# Patient Record
Sex: Male | Born: 1995 | Race: White | Hispanic: No | Marital: Single | Smoking: Former smoker
Health system: Southern US, Community
[De-identification: ages and names within clinical notes are randomized; demographics above are authoritative.]

## PROBLEM LIST (undated history)

## (undated) DIAGNOSIS — G43909 Migraine, unspecified, not intractable, without status migrainosus: Secondary | ICD-10-CM

## (undated) DIAGNOSIS — T7840XA Allergy, unspecified, initial encounter: Secondary | ICD-10-CM

## (undated) HISTORY — DX: Allergy, unspecified, initial encounter: T78.40XA

## (undated) HISTORY — PX: ADENOIDECTOMY: SUR15

## (undated) HISTORY — PX: HYDROCELE EXCISION: SHX482

---
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
## 2014-02-28 ENCOUNTER — Encounter: Payer: Self-pay | Admitting: Family Medicine

## 2014-02-28 ENCOUNTER — Ambulatory Visit (INDEPENDENT_AMBULATORY_CARE_PROVIDER_SITE_OTHER): Payer: Medicaid Other | Admitting: Family Medicine

## 2014-02-28 ENCOUNTER — Ambulatory Visit (INDEPENDENT_AMBULATORY_CARE_PROVIDER_SITE_OTHER): Payer: Medicaid Other | Admitting: *Deleted

## 2014-02-28 VITALS — BP 149/78 | HR 81 | Temp 98.1°F | Wt 160.0 lb

## 2014-02-28 DIAGNOSIS — Z23 Encounter for immunization: Secondary | ICD-10-CM

## 2014-02-28 DIAGNOSIS — A084 Viral intestinal infection, unspecified: Secondary | ICD-10-CM

## 2014-02-28 NOTE — Progress Notes (Signed)
Patient ID: Minda MeoSteven A Eckford, male   DOB: 05/04/1995, 19 y.o.   MRN: [id number] Subjective:   CC: Throwing up  HPI:   1/6 was starting to feel abdominal discomfort and on 7th he vomited what he ate, nonbloody and nonbilious.  Girlfriend was recently sick who maybe had similar symptoms. Febrile 7th night and 8th morning. Able to stay hydrated is almost eating normally. Normal urination. Feels nearly completely better. Had mild itchy throat with cough.  No diarrhea. Some stomach pain when it was uncomfortable, none now. No new medicines or foods.  Missed 02/26/14 workday and requests work note.  Review of Systems - Per HPI.   PMH - ADHD, tobacco abuse, viral gastro    Objective:  Physical Exam BP 149/78 mmHg  Pulse 81  Temp(Src) 98.1 F (36.7 C) (Oral)  Wt 160 lb (72.576 kg) GEN: NAD ABD: Soft, nontender, nondistended PULM: Normal effort    Assessment:     Minda MeoSteven A Guiles is a 19 y.o. male here for recent vomiting.    Plan:     # See problem list and after visit summary for problem-specific plans.   Follow-up: Follow up in 1 week PRN for worsening symptoms.   Leona SingletonMaria T Yailene Badia, MD [facility]

## 2014-02-28 NOTE — Assessment & Plan Note (Signed)
Viral gastro 1/6 for 2-3 days, resolved now. - Work note provided. - Return precautions reviewed.

## 2014-02-28 NOTE — Patient Instructions (Signed)
This was likely a viral bug.  I am glad you are feeling better. Stay hydrated. If you have worsened symptoms, return for evaluation.  Best,  Nicholas SingletonMaria T Sherica Paternostro, MD

## 2014-06-05 ENCOUNTER — Encounter (HOSPITAL_COMMUNITY): Payer: Self-pay | Admitting: *Deleted

## 2014-06-05 ENCOUNTER — Telehealth: Payer: Self-pay | Admitting: Family Medicine

## 2014-06-05 ENCOUNTER — Emergency Department (INDEPENDENT_AMBULATORY_CARE_PROVIDER_SITE_OTHER)
Admission: EM | Admit: 2014-06-05 | Discharge: 2014-06-05 | Disposition: A | Discharge status: Home / Self Care | Payer: Medicaid Other | Source: Home / Self Care | Attending: Family Medicine | Admitting: Family Medicine

## 2014-06-05 DIAGNOSIS — L738 Other specified follicular disorders: Secondary | ICD-10-CM | POA: Diagnosis not present

## 2014-06-05 MED ORDER — DOXYCYCLINE HYCLATE 100 MG PO CAPS: 100.0000 mg | ORAL_CAPSULE | Freq: Two times a day (BID) | ORAL | Status: DC

## 2014-06-05 NOTE — ED Notes (Signed)
Pt  Reports  Several  Red swollen   Boils under  l  Armpit      X  2-3  Days         Has  Had  Similar   Boils  Other  Parts of  His  Body

## 2014-06-05 NOTE — ED Provider Notes (Signed)
CSN: [id number]     Arrival date & time 06/05/14  1445 History   First MD Initiated Contact with Patient 06/05/14 1520     Chief Complaint  Patient presents with  . Abscess   (Consider location/radiation/quality/duration/timing/severity/associated sxs/prior Treatment) Patient is a 19 y.o. male presenting with abscess. The history is provided by the patient.  Abscess Location:  Shoulder/arm Shoulder/arm abscess location:  L axilla Abscess quality: induration, painful and redness   Abscess quality: not draining and no fluctuance   Red streaking: no   Duration:  3 days Progression:  Unchanged Pain details:    Quality:  Sharp   Severity:  Mild Chronicity:  Recurrent Relieved by:  None tried Worsened by:  Nothing tried Ineffective treatments:  None tried Associated symptoms: no fever     Past Medical History  Diagnosis Date  . Attention deficit disorder   . Asthma   . Allergy   . Migraines    Past Surgical History  Procedure Laterality Date  . Adenoidectomy    . Hydrocele excision     Family History  Problem Relation Age of Onset  . Bipolar disorder Mother    History  Substance Use Topics  . Smoking status: Former Smoker    Types: Cigarettes  . Smokeless tobacco: Not on file  . Alcohol Use: No    Review of Systems  Constitutional: Negative.  Negative for fever.  Skin: Positive for rash.    Allergies  Bee venom and Cefaclor  Home Medications   Prior to Admission medications   Medication Sig Start Date End Date Taking? Authorizing Provider  albuterol (PROVENTIL HFA;VENTOLIN HFA) 108 (90 BASE) MCG/ACT inhaler Inhale 2 puffs into the lungs every 4 (four) hours as needed. For shortness of breath 11/02/12   Ozella Rocks, MD  cyclobenzaprine (FLEXERIL) 10 MG tablet Take 1 tablet (10 mg total) by mouth 3 (three) times daily as needed for muscle spasms. 10/18/13   Renee A Kuneff, DO  doxycycline (VIBRAMYCIN) 100 MG capsule Take 1 capsule (100 mg total) by mouth 2  (two) times daily. 06/05/14   Linna Hoff, MD  EPINEPHrine (EPIPEN) 0.3 mg/0.3 mL SOAJ injection Inject 0.3 mLs (0.3 mg total) into the muscle once. As needed for allergic reaction 11/02/12   Ozella Rocks, MD  fluticasone [facility]) 50 MCG/ACT nasal spray Place 2 sprays into the nose daily. 11/02/12   Ozella Rocks, MD  ibuprofen (ADVIL,MOTRIN) 400 MG tablet Take 1 tablet (400 mg total) by mouth every 6 (six) hours as needed. 10/22/13   Marissa Sciacca, PA-C  ibuprofen (ADVIL,MOTRIN) 600 MG tablet Take 1 tablet (600 mg total) by mouth every 6 (six) hours as needed. 12/26/12   Ozella Rocks, MD  loperamide (IMODIUM) 2 MG capsule Take 1 capsule (2 mg total) by mouth 4 (four) times daily as needed for diarrhea or loose stools. 05/29/13   Graylon Good, PA-C  methylphenidate (CONCERTA) 27 MG CR tablet Take 1 tablet (27 mg total) by mouth every morning. 09/04/12   Ozella Rocks, MD  methylphenidate (CONCERTA) 27 MG CR tablet Take 1 tablet (27 mg total) by mouth every morning. 09/04/12   Ozella Rocks, MD  methylphenidate (CONCERTA) 27 MG CR tablet Take 1 tablet (27 mg total) by mouth every morning. 09/04/12 10/04/12  Ozella Rocks, MD  nicotine polacrilex (COMMIT) 2 MG lozenge Take 1 lozenge (2 mg total) by mouth as needed for smoking cessation. 03/15/13   Ozella Rocks, MD  ondansetron (ZOFRAN ODT) 4 MG disintegrating tablet Take 1 tablet (4 mg total) by mouth every 8 (eight) hours as needed for nausea or vomiting. 06/25/13   [place] Peaches[place], MD   BP 128/64 mmHg  Pulse 72  Temp(Src) 98 F (36.7 C) (Oral)  Resp 16  SpO2 100% Physical Exam  Constitutional: He is oriented to person, place, and time. He appears well-developed and well-nourished.  Neurological: He is alert and oriented to person, place, and time.  Skin: Skin is warm and dry. Rash noted.  2 erythematous 1.5cm lesions to left axilla, tender, nonfluctuant.  Nursing note and vitals reviewed.   ED Course  Procedures  (including critical care time) Labs Review Labs Reviewed - No data to display  Imaging Review No results found.   MDM   1. Bacterial folliculitis        Linna HoffJames D Kerianne Gurr, MD 06/05/14 [phone]

## 2014-06-05 NOTE — Discharge Instructions (Signed)
Warm compress twice a day when you take the antibiotic, take all of medicine, return as needed. °

## 2014-06-05 NOTE — Telephone Encounter (Signed)
Emergency Line Call  [name] calls to report that he has two bumps under his left arm that are red and swollen and painful. They are not open / bleeding / draining, but the larger of the two "looks like it's about to burst open." His pain is severe enough that it prevents him from moving his arm "all the way" (i.e., through a normal ROM). He has had no chills, N/V, abdominal pain. Mother reports he has had similar places under his other arm that required I&D, but this was quite some time ago.  Advised mother that pt can go to Urgent Care or come into the emergency room, today, to be evaluated for either I&D or abx therapy (or whatever else may be indicated, depending on appearance of the lesions). Mother voiced understanding; pt audible in background answering some questions with answers as above.  Note FYI to Dr. Jordan LikesSchmitz (PCP).  Bobbye Mortonhristopher M Jasun Gasparini, MD PGY-3, [facility] 06/05/2014, 1:53 PM
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
## 2015-06-14 ENCOUNTER — Encounter (HOSPITAL_COMMUNITY): Payer: Self-pay | Admitting: Family Medicine

## 2015-06-14 ENCOUNTER — Emergency Department (HOSPITAL_COMMUNITY): Payer: Self-pay

## 2015-06-14 ENCOUNTER — Emergency Department (HOSPITAL_COMMUNITY)
Admission: EM | Admit: 2015-06-14 | Discharge: 2015-06-14 | Disposition: A | Discharge status: Home / Self Care | Payer: Self-pay | Attending: Emergency Medicine | Admitting: Emergency Medicine

## 2015-06-14 DIAGNOSIS — S3992XA Unspecified injury of lower back, initial encounter: Secondary | ICD-10-CM | POA: Insufficient documentation

## 2015-06-14 DIAGNOSIS — Y9389 Activity, other specified: Secondary | ICD-10-CM | POA: Insufficient documentation

## 2015-06-14 DIAGNOSIS — Z79899 Other long term (current) drug therapy: Secondary | ICD-10-CM | POA: Insufficient documentation

## 2015-06-14 DIAGNOSIS — M25531 Pain in right wrist: Secondary | ICD-10-CM

## 2015-06-14 DIAGNOSIS — S4991XA Unspecified injury of right shoulder and upper arm, initial encounter: Secondary | ICD-10-CM | POA: Insufficient documentation

## 2015-06-14 DIAGNOSIS — Z7951 Long term (current) use of inhaled steroids: Secondary | ICD-10-CM | POA: Insufficient documentation

## 2015-06-14 DIAGNOSIS — Y998 Other external cause status: Secondary | ICD-10-CM | POA: Insufficient documentation

## 2015-06-14 DIAGNOSIS — J45909 Unspecified asthma, uncomplicated: Secondary | ICD-10-CM | POA: Insufficient documentation

## 2015-06-14 DIAGNOSIS — Z792 Long term (current) use of antibiotics: Secondary | ICD-10-CM | POA: Insufficient documentation

## 2015-06-14 DIAGNOSIS — Z87891 Personal history of nicotine dependence: Secondary | ICD-10-CM | POA: Insufficient documentation

## 2015-06-14 DIAGNOSIS — G43909 Migraine, unspecified, not intractable, without status migrainosus: Secondary | ICD-10-CM | POA: Insufficient documentation

## 2015-06-14 DIAGNOSIS — Y92410 Unspecified street and highway as the place of occurrence of the external cause: Secondary | ICD-10-CM | POA: Insufficient documentation

## 2015-06-14 DIAGNOSIS — S6991XA Unspecified injury of right wrist, hand and finger(s), initial encounter: Secondary | ICD-10-CM | POA: Insufficient documentation

## 2015-06-14 DIAGNOSIS — M25511 Pain in right shoulder: Secondary | ICD-10-CM

## 2015-06-14 DIAGNOSIS — F909 Attention-deficit hyperactivity disorder, unspecified type: Secondary | ICD-10-CM | POA: Insufficient documentation

## 2015-06-14 DIAGNOSIS — S199XXA Unspecified injury of neck, initial encounter: Secondary | ICD-10-CM | POA: Insufficient documentation

## 2015-06-14 MED ORDER — CYCLOBENZAPRINE HCL 10 MG PO TABS: 10.0000 mg | ORAL_TABLET | Freq: Two times a day (BID) | ORAL | Status: DC | PRN

## 2015-06-14 MED ORDER — IBUPROFEN 600 MG PO TABS: 600.0000 mg | ORAL_TABLET | Freq: Four times a day (QID) | ORAL | Status: DC | PRN

## 2015-06-14 MED ORDER — HYDROCODONE-ACETAMINOPHEN 5-325 MG PO TABS: 1.0000 | ORAL_TABLET | Freq: Four times a day (QID) | ORAL | Status: DC | PRN
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
## 2015-06-14 NOTE — Discharge Instructions (Signed)
Concussion, Adult °A concussion, or closed-head injury, is a brain injury caused by a direct blow to the head or by a quick and sudden movement (jolt) of the head or neck. Concussions are usually not life-threatening. Even so, the effects of a concussion can be serious. If you have had a concussion before, you are more likely to experience concussion-like symptoms after a direct blow to the head.  °CAUSES °· Direct blow to the head, such as from running into another player during a soccer game, being hit in a fight, or hitting your head on a hard surface. °· A jolt of the head or neck that causes the brain to move back and forth inside the skull, such as in a car crash. °SIGNS AND SYMPTOMS °The signs of a concussion can be hard to notice. Early on, they may be missed by you, family members, and health care providers. You may look fine but act or feel differently. °Symptoms are usually temporary, but they may last for days, weeks, or even longer. Some symptoms may appear right away while others may not show up for hours or days. Every head injury is different. Symptoms include: °· Mild to moderate headaches that will not go away. °· A feeling of pressure inside your head. °· Having more trouble than usual: °¨ Learning or remembering things you have heard. °¨ Answering questions. °¨ Paying attention or concentrating. °¨ Organizing daily tasks. °¨ Making decisions and solving problems. °· Slowness in thinking, acting or reacting, speaking, or reading. °· Getting lost or being easily confused. °· Feeling tired all the time or lacking energy (fatigued). °· Feeling drowsy. °· Sleep disturbances. °¨ Sleeping more than usual. °¨ Sleeping less than usual. °¨ Trouble falling asleep. °¨ Trouble sleeping (insomnia). °· Loss of balance or feeling lightheaded or dizzy. °· Nausea or vomiting. °· Numbness or tingling. °· Increased sensitivity to: °¨ Sounds. °¨ Lights. °¨ Distractions. °· Vision problems or eyes that tire  easily. °· Diminished sense of taste or smell. °· Ringing in the ears. °· Mood changes such as feeling sad or anxious. °· Becoming easily irritated or angry for little or no reason. °· Lack of motivation. °· Seeing or hearing things other people do not see or hear (hallucinations). °DIAGNOSIS °Your health care provider can usually diagnose a concussion based on a description of your injury and symptoms. He or she will ask whether you passed out (lost consciousness) and whether you are having trouble remembering events that happened right before and during your injury. °Your evaluation might include: °· A brain scan to look for signs of injury to the brain. Even if the test shows no injury, you may still have a concussion. °· Blood tests to be sure other problems are not present. °TREATMENT °· Concussions are usually treated in an emergency department, in urgent care, or at a clinic. You may need to stay in the hospital overnight for further treatment. °· Tell your health care provider if you are taking any medicines, including prescription medicines, over-the-counter medicines, and natural remedies. Some medicines, such as blood thinners (anticoagulants) and aspirin, may increase the chance of complications. Also tell your health care provider whether you have had alcohol or are taking illegal drugs. This information may affect treatment. °· Your health care provider will send you home with important instructions to follow. °· How fast you will recover from a concussion depends on many factors. These factors include how severe your concussion is, what part of your brain was injured,   your age, and how healthy you were before the concussion. °· Most people with mild injuries recover fully. Recovery can take time. In general, recovery is slower in older persons. Also, persons who have had a concussion in the past or have other medical problems may find that it takes longer to recover from their current injury. °HOME  CARE INSTRUCTIONS °General Instructions °· Carefully follow the directions your health care provider gave you. °· Only take over-the-counter or prescription medicines for pain, discomfort, or fever as directed by your health care provider. °· Take only those medicines that your health care provider has approved. °· Do not drink alcohol until your health care provider says you are well enough to do so. Alcohol and certain other drugs may slow your recovery and can put you at risk of further injury. °· If it is harder than usual to remember things, write them down. °· If you are easily distracted, try to do one thing at a time. For example, do not try to watch TV while fixing dinner. °· Talk with family members or close friends when making important decisions. °· Keep all follow-up appointments. Repeated evaluation of your symptoms is recommended for your recovery. °· Watch your symptoms and tell others to do the same. Complications sometimes occur after a concussion. Older adults with a brain injury may have a higher risk of serious complications, such as a blood clot on the brain. °· Tell your teachers, school nurse, school counselor, coach, athletic trainer, or work manager about your injury, symptoms, and restrictions. Tell them about what you can or cannot do. They should watch for: °¨ Increased problems with attention or concentration. °¨ Increased difficulty remembering or learning new information. °¨ Increased time needed to complete tasks or assignments. °¨ Increased irritability or decreased ability to cope with stress. °¨ Increased symptoms. °· Rest. Rest helps the brain to heal. Make sure you: °¨ Get plenty of sleep at night. Avoid staying up late at night. °¨ Keep the same bedtime hours on weekends and weekdays. °¨ Rest during the day. Take daytime naps or rest breaks when you feel tired. °· Limit activities that require a lot of thought or concentration. These include: °¨ Doing homework or job-related  work. °¨ Watching TV. °¨ Working on the computer. °· Avoid any situation where there is potential for another head injury (football, hockey, soccer, basketball, martial arts, downhill snow sports and horseback riding). Your condition will get worse every time you experience a concussion. You should avoid these activities until you are evaluated by the appropriate follow-up health care providers. °Returning To Your Regular Activities °You will need to return to your normal activities slowly, not all at once. You must give your body and brain enough time for recovery. °· Do not return to sports or other athletic activities until your health care provider tells you it is safe to do so. °· Ask your health care provider when you can drive, ride a bicycle, or operate heavy machinery. Your ability to react may be slower after a brain injury. Never do these activities if you are dizzy. °· Ask your health care provider about when you can return to work or school. °Preventing Another Concussion °It is very important to avoid another brain injury, especially before you have recovered. In rare cases, another injury can lead to permanent brain damage, brain swelling, or death. The risk of this is greatest during the first 7-10 days after a head injury. Avoid injuries by: °· Wearing a   seat belt when riding in a car.  Drinking alcohol only in moderation.  Wearing a helmet when biking, skiing, skateboarding, skating, or doing similar activities.  Avoiding activities that could lead to a second concussion, such as contact or recreational sports, until your health care provider says it is okay.  Taking safety measures in your home.  Remove clutter and tripping hazards from floors and stairways.  Use grab bars in bathrooms and handrails by stairs.  Place non-slip mats on floors and in bathtubs.  Improve lighting in dim areas. SEEK MEDICAL CARE IF:  You have increased problems paying attention or  concentrating.  You have increased difficulty remembering or learning new information.  You need more time to complete tasks or assignments than before.  You have increased irritability or decreased ability to cope with stress.  You have more symptoms than before. Seek medical care if you have any of the following symptoms for more than 2 weeks after your injury:  Lasting (chronic) headaches.  Dizziness or balance problems.  Nausea.  Vision problems.  Increased sensitivity to noise or light.  Depression or mood swings.  Anxiety or irritability.  Memory problems.  Difficulty concentrating or paying attention.  Sleep problems.  Feeling tired all the time. SEEK IMMEDIATE MEDICAL CARE IF:  You have severe or worsening headaches. These may be a sign of a blood clot in the brain.  You have weakness (even if only in one hand, leg, or part of the face).  You have numbness.  You have decreased coordination.  You vomit repeatedly.  You have increased sleepiness.  One pupil is larger than the other.  You have convulsions.  You have slurred speech.  You have increased confusion. This may be a sign of a blood clot in the brain.  You have increased restlessness, agitation, or irritability.  You are unable to recognize people or places.  You have neck pain.  It is difficult to wake you up.  You have unusual behavior changes.  You lose consciousness. MAKE SURE YOU:  Understand these instructions.  Will watch your condition.  Will get help right away if you are not doing well or get worse.   This information is not intended to replace advice given to you by your health care provider. Make sure you discuss any questions you have with your health care provider.   Document Released: 04/27/2003 Document Revised: 02/25/2014 Document Reviewed: 08/27/2012 Elsevier Interactive Patient Education 2016 Else[employer] Inc[employer] Shoulder Sprain A shoulder sprain is a partial or  complete tear in one of the tough, fiber-like tissues (ligaments) in the shoulder. The ligaments in the shoulder help to hold the shoulder in place. CAUSES This condition may be caused by:  A fall.  A hit to the shoulder.  A twist of the arm. RISK FACTORS This condition is more likely to develop in:  People who play sports.  People who have problems with balance or coordination. SYMPTOMS Symptoms of this condition include:  Pain when moving the shoulder.  Limited ability to move the shoulder.  Swelling and tenderness on top of the shoulder.  Warmth in the shoulder.  A change in the shape of the shoulder.  Redness or bruising on the shoulder. DIAGNOSIS This condition is diagnosed with a physical exam. During the exam, you may be asked to do simple exercises with your shoulder. You may also have imaging tests, such as X-rays, MRI, or a CT scan. These tests can show how severe the sprain is. TREATMENT  This condition may be treated with:  Rest.  Pain medicine.  Ice.  A sling or brace. This is used to keep the arm still while the shoulder is healing.  Physical therapy or rehabilitation exercises. These help to improve the range of motion and strength of the shoulder.  Surgery (rare). Surgery may be needed if the sprain caused a joint to become unstable. Surgery may also be needed to reduce pain. Some people may develop ongoing shoulder pain or lose some range of motion in the shoulder. However, most people do not develop long-term problems. HOME CARE INSTRUCTIONS  Rest.  Take over-the-counter and prescription medicines only as told by your health care provider.  If directed, apply ice to the area:  Put ice in a plastic bag.  Place a towel between your skin and the bag.  Leave the ice on for 20 minutes, 2-3 times per day.  If you were given a shoulder sling or brace:  Wear it as told.  Remove it to shower or bathe.  Move your arm only as much as told by  your health care provider, but keep your hand moving to prevent swelling.  If you were shown how to do any exercises, do them as told by your health care provider.  Keep all follow-up visits as told by your health care provider. This is important. SEEK MEDICAL CARE IF:  Your pain gets worse.  Your pain is not relieved with medicines.  You have increased redness or swelling. SEEK IMMEDIATE MEDICAL CARE IF:  You have a fever.  You cannot move your arm or shoulder.  You develop numbness or tingling in your arms, hands, or fingers.   This information is not intended to replace advice given to you by your health care provider. Make sure you discuss any questions you have with your health care provider.   Document Released: 06/23/2008 Document Revised: 10/26/2014 Document Reviewed: 05/30/2014 Elsevier Interactive Patient Education 2016 [employer]. Tourist information centre manager It is common to have multiple bruises and sore muscles after a motor vehicle collision (MVC). These tend to feel worse for the first 24 hours. You may have the most stiffness and soreness over the first several hours. You may also feel worse when you wake up the first morning after your collision. After this point, you will usually begin to improve with each day. The speed of improvement often depends on the severity of the collision, the number of injuries, and the location and nature of these injuries. HOME CARE INSTRUCTIONS  Put ice on the injured area.  Put ice in a plastic bag.  Place a towel between your skin and the bag.  Leave the ice on for 15-20 minutes, 3-4 times a day, or as directed by your health care provider.  Drink enough fluids to keep your urine clear or pale yellow. Do not drink alcohol.  Take a warm shower or bath once or twice a day. This will increase blood flow to sore muscles.  You may return to activities as directed by your caregiver. Be careful when lifting, as this may aggravate neck  or back pain.  Only take over-the-counter or prescription medicines for pain, discomfort, or fever as directed by your caregiver. Do not use aspirin. This may increase bruising and bleeding. SEEK IMMEDIATE MEDICAL CARE IF:  You have numbness, tingling, or weakness in the arms or legs.  You develop severe headaches not relieved with medicine.  You have severe neck pain, especially tenderness in the middle  of the back of your neck.  You have changes in bowel or bladder control.  There is increasing pain in any area of the body.  You have shortness of breath, light-headedness, dizziness, or fainting.  You have chest pain.  You feel sick to your stomach (nauseous), throw up (vomit), or sweat.  You have increasing abdominal discomfort.  There is blood in your urine, stool, or vomit.  You have pain in your shoulder (shoulder strap areas).  You feel your symptoms are getting worse. MAKE SURE YOU:  Understand these instructions.  Will watch your condition.  Will get help right away if you are not doing well or get worse.   This information is not intended to replace advice given to you by your health care provider. Make sure you discuss any questions you have with your health care provider.   Document Released: 02/04/2005 Document Revised: 02/25/2014 Document Reviewed: 07/04/2010 Elsevier Interactive Patient Education [employer] Else[employer] Inc[employer]
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
## 2016-01-24 ENCOUNTER — Encounter (HOSPITAL_COMMUNITY): Payer: Self-pay

## 2016-01-24 ENCOUNTER — Emergency Department (HOSPITAL_COMMUNITY)
Admission: EM | Admit: 2016-01-24 | Discharge: 2016-01-24 | Disposition: A | Discharge status: Home / Self Care | Payer: Medicaid Other | Attending: Emergency Medicine | Admitting: Emergency Medicine

## 2016-01-24 DIAGNOSIS — Z87891 Personal history of nicotine dependence: Secondary | ICD-10-CM | POA: Insufficient documentation

## 2016-01-24 DIAGNOSIS — R197 Diarrhea, unspecified: Secondary | ICD-10-CM

## 2016-01-24 DIAGNOSIS — B9789 Other viral agents as the cause of diseases classified elsewhere: Secondary | ICD-10-CM

## 2016-01-24 DIAGNOSIS — J069 Acute upper respiratory infection, unspecified: Secondary | ICD-10-CM | POA: Insufficient documentation

## 2016-01-24 DIAGNOSIS — J45909 Unspecified asthma, uncomplicated: Secondary | ICD-10-CM | POA: Insufficient documentation

## 2016-01-24 NOTE — Discharge Instructions (Signed)
Please read and follow all provided instructions.  Your diagnoses today include:  1. Viral URI with cough   2. Diarrhea, unspecified type     You appear to have an upper respiratory infection (URI). An upper respiratory tract infection, or cold, is a viral infection of the air passages leading to the lungs. It should improve gradually after 5-7 days. You may have a lingering cough that lasts for 2- 4 weeks after the infection.  Tests performed today include:  Vital signs. See below for your results today.   Medications prescribed:  Take any prescribed medications only as directed. Treatment for your infection is aimed at treating the symptoms. There are no medications, such as antibiotics, that will cure your infection.   Home care instructions:  Follow any educational materials contained in this packet.  Use any over-the-counter medications as directed on the packaging for symptom relief.    Your illness is contagious and can be spread to others, especially during the first 3 or 4 days. It cannot be cured by antibiotics or other medicines. Take basic precautions such as washing your hands often, covering your mouth when you cough or sneeze, and avoiding public places where you could spread your illness to others.   Please continue drinking plenty of fluids.  Use over-the-counter medicines as needed as directed on packaging for symptom relief.  You may also use ibuprofen or tylenol as directed on packaging for pain or fever.  Do not take multiple medicines containing Tylenol or acetaminophen to avoid taking too much of this medication.  Follow-up instructions: Please follow-up with your primary care provider in the next 3 days for further evaluation of your symptoms if you are not feeling better.   Return instructions:   Please return to the Emergency Department if you experience worsening symptoms.   RETURN IMMEDIATELY IF you develop shortness of breath, confusion or altered mental  status, a new rash, become dizzy, faint, or poorly responsive, or are unable to be cared for at home.  Please return if you have persistent vomiting and cannot keep down fluids or develop a fever that is not controlled by tylenol or motrin.    Please return if you have any other emergent concerns.  Additional Information:  Your vital signs today were: BP 131/75 (BP Location: Left Arm)    Pulse 94    Temp 98.4 F (36.9 C) (Oral)    Resp 18    SpO2 99%  If your blood pressure (BP) was elevated above 135/85 this visit, please have this repeated by your doctor within one month. --------------

## 2016-01-24 NOTE — ED Notes (Addendum)
Papers given to patient and reviewed  Coupon given code # [id number]

## 2016-01-24 NOTE — ED Triage Notes (Signed)
Patient complains of congestion, cough and headache x 3 days. States that he thinks he is getting the flu, NAD

## 2016-01-24 NOTE — ED Notes (Signed)
Pt. Family member sts that she thinks he may have a fever because he was diaphoretic last night

## 2016-01-24 NOTE — ED Provider Notes (Signed)
[facility]-EMERGENCY DEPT Provider Note   CSN: [id number] Arrival date & time: 01/24/16  1108  By signing my name below, I, Avnee Patel, attest that this documentation has been prepared under the direction and in the presence of  RaytheonJosh Bailen Geffre PA-C. Electronically Signed: Clovis PuAvnee Patel, ED Scribe. 01/24/16. 12:16 PM.   History   Chief Complaint Chief Complaint  Patient presents with  . Nasal Congestion  . Headache   The history is provided by the patient. No language interpreter was used.   HPI Comments:  Nicholas Franco is a 20 y.o. male who presents to the Emergency Department complaining of sudden onset, persistent headache and congestion x 3 days. Pt notes associated rhinorrhea, sore throat, cough, diaphoresis, chills, subjective fevers, nausea and diarrhea. No alleviating factors noted. Sick contacts noted. Pt denies hematochezia, ear pain, vomiting, any other associated symptoms and modifying factors at this time.    Past Medical History:  Diagnosis Date  . Allergy   . Asthma   . Attention deficit disorder   . Migraines     Patient Active Problem List   Diagnosis Date Noted  . Neck pain, acute 10/18/2013  . Viral gastroenteritis 06/25/2013  . Left hand pain 12/22/2012  . Cough 11/02/2012  . Tobacco abuse 08/05/2012  . ATTENTION DEFICIT, W/HYPERACTIVITY 04/17/2006    Past Surgical History:  Procedure Laterality Date  . ADENOIDECTOMY    . HYDROCELE EXCISION      Home Medications    Prior to Admission medications   Medication Sig Start Date End Date Taking? Authorizing Provider  albuterol (PROVENTIL HFA;VENTOLIN HFA) 108 (90 BASE) MCG/ACT inhaler Inhale 2 puffs into the lungs every 4 (four) hours as needed. For shortness of breath 11/02/12   Ozella Rocksavid J Merrell, MD  cyclobenzaprine (FLEXERIL) 10 MG tablet Take 1 tablet (10 mg total) by mouth 2 (two) times daily as needed for muscle spasms. 06/14/15   Roxy Horsemanobert Browning, PA-C  doxycycline (VIBRAMYCIN) 100 MG capsule Take 1  capsule (100 mg total) by mouth 2 (two) times daily. 06/05/14   Linna HoffJames D Kindl, MD  EPINEPHrine (EPIPEN) 0.3 mg/0.3 mL SOAJ injection Inject 0.3 mLs (0.3 mg total) into the muscle once. As needed for allergic reaction 11/02/12   Ozella Rocksavid J Merrell, MD  fluticasone [facility](FLONASE) 50 MCG/ACT nasal spray Place 2 sprays into the nose daily. 11/02/12   Ozella Rocksavid J Merrell, MD  HYDROcodone-acetaminophen (NORCO/VICODIN) 5-325 MG tablet Take 1-2 tablets by mouth every 6 (six) hours as needed. 06/14/15   Roxy Horsemanobert Browning, PA-C  ibuprofen (ADVIL,MOTRIN) 600 MG tablet Take 1 tablet (600 mg total) by mouth every 6 (six) hours as needed. 06/14/15   Roxy Horsemanobert Browning, PA-C  loperamide (IMODIUM) 2 MG capsule Take 1 capsule (2 mg total) by mouth 4 (four) times daily as needed for diarrhea or loose stools. 05/29/13   Graylon GoodZachary H Baker, PA-C  methylphenidate (CONCERTA) 27 MG CR tablet Take 1 tablet (27 mg total) by mouth every morning. 09/04/12   Ozella Rocksavid J Merrell, MD  methylphenidate (CONCERTA) 27 MG CR tablet Take 1 tablet (27 mg total) by mouth every morning. 09/04/12   Ozella Rocksavid J Merrell, MD  methylphenidate (CONCERTA) 27 MG CR tablet Take 1 tablet (27 mg total) by mouth every morning. 09/04/12 10/04/12  Ozella Rocksavid J Merrell, MD  nicotine polacrilex (COMMIT) 2 MG lozenge Take 1 lozenge (2 mg total) by mouth as needed for smoking cessation. 03/15/13   Ozella Rocksavid J Merrell, MD  ondansetron (ZOFRAN ODT) 4 MG disintegrating tablet Take 1 tablet (4 mg  total) by mouth every 8 (eight) hours as needed for nausea or vomiting. 06/25/13   Dayarmys [place] Peaches, MD    Family History Family History  Problem Relation Age of Onset  . Bipolar disorder Mother     Social History Social History  Substance Use Topics  . Smoking status: Former Smoker    Types: Cigarettes  . Smokeless tobacco: Not on file  . Alcohol use No     Allergies   Bee venom and Cefaclor   Review of Systems Review of Systems  Constitutional: Positive for chills, diaphoresis and fever  (subjective).  HENT: Positive for congestion and sore throat.   Respiratory: Positive for cough.   Gastrointestinal: Positive for abdominal pain, diarrhea and nausea. Negative for blood in stool.  Neurological: Positive for headaches.    Physical Exam Updated Vital Signs BP 131/75 (BP Location: Left Arm)   Pulse 94   Temp 98.4 F (36.9 C) (Oral)   Resp 18   SpO2 99%   Physical Exam  Constitutional: He appears well-developed and well-nourished. No distress.  HENT:  Head: Normocephalic and atraumatic.  Right Ear: Tympanic membrane, external ear and ear canal normal.  Left Ear: External ear and ear canal normal. Tympanic membrane is scarred.  Nose: Mucosal edema present. No rhinorrhea.  Mouth/Throat: Uvula is midline and mucous membranes are normal. Mucous membranes are not dry. No trismus in the jaw. No uvula swelling. Posterior oropharyngeal erythema (mild) present. No oropharyngeal exudate, posterior oropharyngeal edema or tonsillar abscesses.  Eyes: Conjunctivae are normal. Right eye exhibits no discharge. Left eye exhibits no discharge.  Neck: Normal range of motion. Neck supple.  Cardiovascular: Normal rate, regular rhythm and normal heart sounds.   Pulmonary/Chest: Effort normal and breath sounds normal. No respiratory distress. He has no wheezes. He has no rales.  Abdominal: Soft. He exhibits no distension. There is no tenderness. There is no guarding.  Neurological: He is alert.  Skin: Skin is warm and dry.  Psychiatric: He has a normal mood and affect.  Nursing note and vitals reviewed.   ED Treatments / Results  DIAGNOSTIC STUDIES:  Oxygen Saturation is 99% on RA, normal by my interpretation.    COORDINATION OF CARE:  12:15 PM Pt advised to stay hydrated and to take tylenol and motrin. Discussed treatment plan with pt at bedside and pt agreed to plan.  Procedures Procedures (including critical care time)  Medications Ordered in ED Medications - No data to  display   Initial Impression / Assessment and Plan / ED Course  I have reviewed the triage vital signs and the nursing notes.  Pertinent labs & imaging results that were available during my care of the patient were reviewed by me and considered in my medical decision making (see chart for details).  Clinical Course    Patient counseled on supportive care for viral URI and s/s to return including worsening symptoms, persistent fever, persistent vomiting, or if they have any other concerns. Urged to see PCP if symptoms persist for more than 3 days. Patient verbalizes understanding and agrees with plan.   Pt symptoms consistent with URI. Pt will be discharged with symptomatic treatment.  Discussed return precautions.  Pt is hemodynamically stable & in NAD prior to discharge.   Final Clinical Impressions(s) / ED Diagnoses   Final diagnoses:  Viral URI with cough  Diarrhea, unspecified type   Patient with symptoms consistent with a viral syndrome. Vitals are stable, no fever. No signs of dehydration. Lung  exam normal, no signs of pneumonia. Supportive therapy indicated with return if symptoms worsen.     New Prescriptions New Prescriptions   No medications on file  I personally performed the services described in this documentation, which was scribed in my presence. The recorded information has been reviewed and is accurate.      Renne CriglerJoshua Aymen Widrig, PA-C 01/24/16 1242    Nira ConnPedro Eduardo Cardama, MD 01/25/16 [phone]

## 2016-03-07 ENCOUNTER — Emergency Department (HOSPITAL_COMMUNITY)
Admission: EM | Admit: 2016-03-07 | Discharge: 2016-03-07 | Disposition: A | Discharge status: Home / Self Care | Payer: Self-pay | Attending: Emergency Medicine | Admitting: Emergency Medicine

## 2016-03-07 ENCOUNTER — Emergency Department (HOSPITAL_COMMUNITY): Payer: Self-pay

## 2016-03-07 ENCOUNTER — Encounter (HOSPITAL_COMMUNITY): Payer: Self-pay | Admitting: Emergency Medicine

## 2016-03-07 DIAGNOSIS — Y929 Unspecified place or not applicable: Secondary | ICD-10-CM | POA: Insufficient documentation

## 2016-03-07 DIAGNOSIS — Z23 Encounter for immunization: Secondary | ICD-10-CM | POA: Insufficient documentation

## 2016-03-07 DIAGNOSIS — Y999 Unspecified external cause status: Secondary | ICD-10-CM | POA: Insufficient documentation

## 2016-03-07 DIAGNOSIS — W320XXA Accidental handgun discharge, initial encounter: Secondary | ICD-10-CM | POA: Insufficient documentation

## 2016-03-07 DIAGNOSIS — W34010A Accidental discharge of airgun, initial encounter: Secondary | ICD-10-CM

## 2016-03-07 DIAGNOSIS — Z87891 Personal history of nicotine dependence: Secondary | ICD-10-CM | POA: Insufficient documentation

## 2016-03-07 DIAGNOSIS — S91331A Puncture wound without foreign body, right foot, initial encounter: Secondary | ICD-10-CM | POA: Insufficient documentation

## 2016-03-07 DIAGNOSIS — J45909 Unspecified asthma, uncomplicated: Secondary | ICD-10-CM | POA: Insufficient documentation

## 2016-03-07 DIAGNOSIS — Y9389 Activity, other specified: Secondary | ICD-10-CM | POA: Insufficient documentation

## 2016-03-07 DIAGNOSIS — F909 Attention-deficit hyperactivity disorder, unspecified type: Secondary | ICD-10-CM | POA: Insufficient documentation

## 2016-03-07 MED ORDER — SULFAMETHOXAZOLE-TRIMETHOPRIM 800-160 MG PO TABS: 1.0000 | ORAL_TABLET | Freq: Two times a day (BID) | ORAL | 0 refills | Status: AC

## 2016-03-07 MED ORDER — BACITRACIN ZINC 500 UNIT/GM EX OINT
1.0000 "application " | TOPICAL_OINTMENT | Freq: Two times a day (BID) | CUTANEOUS | Status: DC
  Administered 2016-03-07: 1 via TOPICAL
  Filled 2016-03-07: qty 0.9

## 2016-03-07 MED ORDER — HYDROCODONE-ACETAMINOPHEN 5-325 MG PO TABS
1.0000 | ORAL_TABLET | Freq: Once | ORAL | Status: AC
  Administered 2016-03-07: 1 via ORAL
  Filled 2016-03-07: qty 1

## 2016-03-07 MED ORDER — TETANUS-DIPHTH-ACELL PERTUSSIS 5-2.5-18.5 LF-MCG/0.5 IM SUSP
0.5000 mL | Freq: Once | INTRAMUSCULAR | Status: AC
  Administered 2016-03-07: 0.5 mL via INTRAMUSCULAR
  Filled 2016-03-07: qty 0.5

## 2016-03-07 MED ORDER — BACITRACIN ZINC 500 UNIT/GM EX OINT: 1.0000 "application " | TOPICAL_OINTMENT | Freq: Two times a day (BID) | CUTANEOUS | 0 refills | Status: DC

## 2016-03-07 MED ORDER — NAPROXEN 500 MG PO TABS: 500.0000 mg | ORAL_TABLET | Freq: Two times a day (BID) | ORAL | 0 refills | Status: DC

## 2016-03-07 NOTE — Discharge Instructions (Signed)
If you develop pus coming out of the wound, redness streaking appear foot and leg, worsening pain or fevers please return to the emergency room for reevaluation.

## 2016-03-07 NOTE — ED Notes (Signed)
Pt fitted properly with crutches. Pt reports he has used multiple times and is comfortable ambulating independently with them. Pt demonstrated proper technique using crutches.
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
## 2016-03-07 NOTE — Progress Notes (Signed)
Orthopedic Tech Progress Note Patient Details:  Nicholas Franco 07-23-1995 [id number]  Ortho Devices Type of Ortho Device: Crutches Ortho Device/Splint Location: delivered crutched to rn Ortho Device/Splint Interventions: [name]   [name] 03/07/2016, 10:12 PM

## 2016-03-07 NOTE — ED Notes (Signed)
Ortho paged regarding pt crutches.

## 2018-01-10 ENCOUNTER — Ambulatory Visit (HOSPITAL_COMMUNITY)
Admission: EM | Admit: 2018-01-10 | Discharge: 2018-01-10 | Disposition: A | Discharge status: Home / Self Care | Payer: Self-pay | Attending: Family Medicine | Admitting: Family Medicine

## 2018-01-10 ENCOUNTER — Encounter (HOSPITAL_COMMUNITY): Payer: Self-pay | Admitting: Emergency Medicine

## 2018-01-10 DIAGNOSIS — J069 Acute upper respiratory infection, unspecified: Secondary | ICD-10-CM

## 2018-01-10 DIAGNOSIS — B9789 Other viral agents as the cause of diseases classified elsewhere: Secondary | ICD-10-CM

## 2018-01-10 MED ORDER — BENZONATATE 100 MG PO CAPS: 100.0000 mg | ORAL_CAPSULE | Freq: Three times a day (TID) | ORAL | 0 refills | Status: DC | PRN

## 2018-01-10 MED ORDER — IPRATROPIUM BROMIDE 0.03 % NA SOLN: 2.0000 | Freq: Two times a day (BID) | NASAL | 0 refills | Status: DC

## 2018-01-10 NOTE — ED Provider Notes (Signed)
[facility]    CSN: [id number] Arrival date & time: 01/10/18  1342     History   Chief Complaint Chief Complaint  Patient presents with  . URI    HPI Nicholas Franco is a 23 y.o. male.   HPI   Upper Respiratory Infection Patient complains of symptoms of a URI. Symptoms include congestion and cough. Onset of symptoms was 7 days ago, gradually improving since that time.   He is drinking plenty of fluids. He has not attempted treatment with any OTC medications. Reports a distant history of asthma, although denies SOB, chest tightness, or wheezing today. He needs a work note today as he has missed work today. He denies fever or   body aches. No known influenza exposure.     Past Medical History:  Diagnosis Date  . Allergy   . Asthma   . Attention deficit disorder   . Migraines     Patient Active Problem List   Diagnosis Date Noted  . Neck pain, acute 10/18/2013  . Viral gastroenteritis 06/25/2013  . Left hand pain 12/22/2012  . Cough 11/02/2012  . Tobacco abuse 08/05/2012  . ATTENTION DEFICIT, W/HYPERACTIVITY 04/17/2006    Past Surgical History:  Procedure Laterality Date  . ADENOIDECTOMY    . HYDROCELE EXCISION         Home Medications    Prior to Admission medications   Medication Sig Start Date End Date Taking? Authorizing Provider  albuterol (PROVENTIL HFA;VENTOLIN HFA) 108 (90 BASE) MCG/ACT inhaler Inhale 2 puffs into the lungs every 4 (four) hours as needed. For shortness of breath 11/02/12   Ozella Rocks, MD  bacitracin ointment Apply 1 application topically 2 (two) times daily. 03/07/16   Everlene Farrier, PA-C  cyclobenzaprine (FLEXERIL) 10 MG tablet Take 1 tablet (10 mg total) by mouth 2 (two) times daily as needed for muscle spasms. Patient not taking: Reported on 01/10/2018 06/14/15   Roxy Horseman, PA-C  doxycycline (VIBRAMYCIN) 100 MG capsule Take 1 capsule (100 mg total) by mouth 2 (two) times daily. Patient not taking: Reported  on 01/10/2018 06/05/14   Linna Hoff, MD  EPINEPHrine (EPIPEN) 0.3 mg/0.3 mL SOAJ injection Inject 0.3 mLs (0.3 mg total) into the muscle once. As needed for allergic reaction 11/02/12   Ozella Rocks, MD  fluticasone [facility]) 50 MCG/ACT nasal spray Place 2 sprays into the nose daily. 11/02/12   Ozella Rocks, MD  HYDROcodone-acetaminophen (NORCO/VICODIN) 5-325 MG tablet Take 1-2 tablets by mouth every 6 (six) hours as needed. Patient not taking: Reported on 01/10/2018 06/14/15   Roxy Horseman, PA-C  ibuprofen (ADVIL,MOTRIN) 600 MG tablet Take 1 tablet (600 mg total) by mouth every 6 (six) hours as needed. 06/14/15   Roxy Horseman, PA-C  loperamide (IMODIUM) 2 MG capsule Take 1 capsule (2 mg total) by mouth 4 (four) times daily as needed for diarrhea or loose stools. 05/29/13   Graylon Good, PA-C  methylphenidate (CONCERTA) 27 MG CR tablet Take 1 tablet (27 mg total) by mouth every morning. 09/04/12   Ozella Rocks, MD  methylphenidate (CONCERTA) 27 MG CR tablet Take 1 tablet (27 mg total) by mouth every morning. Patient not taking: Reported on 01/10/2018 09/04/12   Ozella Rocks, MD  methylphenidate (CONCERTA) 27 MG CR tablet Take 1 tablet (27 mg total) by mouth every morning. Patient not taking: Reported on 01/10/2018 09/04/12 10/04/12  Ozella Rocks, MD  naproxen (NAPROSYN) 500 MG tablet Take 1 tablet (500  mg total) by mouth 2 (two) times daily with a meal. As needed for pain 03/07/16   Everlene Farrieransie, William, PA-C  nicotine polacrilex (COMMIT) 2 MG lozenge Take 1 lozenge (2 mg total) by mouth as needed for smoking cessation. 03/15/13   Ozella RocksMerrell, David J, MD  ondansetron (ZOFRAN ODT) 4 MG disintegrating tablet Take 1 tablet (4 mg total) by mouth every 8 (eight) hours as needed for nausea or vomiting. 06/25/13   [place] Peaches[place], Donetta Pottsayarmys, MD    Family History Family History  Problem Relation Age of Onset  . Bipolar disorder Mother     Social History Social History   Tobacco Use    . Smoking status: Former Smoker    Types: Cigarettes  Substance Use Topics  . Alcohol use: No  . Drug use: No     Allergies   Bee venom and Cefaclor   Review of Systems Review of Systems Pertinent negatives listed in HPI  Physical Exam Triage Vital Signs ED Triage Vitals [01/10/18 1438]  Enc Vitals Group     BP 134/72     Pulse Rate 70     Resp 18     Temp 97.7 F (36.5 C)     Temp Source Oral     SpO2 99 %     Weight      Height      Head Circumference      Peak Flow      Pain Score 2     Pain Loc      Pain Edu?      Excl. in GC?    No data found.  Updated Vital Signs BP 134/72 (BP Location: Right Arm)   Pulse 70   Temp 97.7 F (36.5 C) (Oral)   Resp 18   SpO2 99%   Visual Acuity Right Eye Distance:   Left Eye Distance:   Bilateral Distance:    Right Eye Near:   Left Eye Near:    Bilateral Near:     Physical Exam General appearance: alert, well developed, well nourished, cooperative and in no distress Head: Normocephalic, without obvious abnormality, atraumatic Respiratory: Respirations even and unlabored, normal respiratory rate Extremities: No gross deformities Skin: Skin color, texture, turgor normal. No rashes seen  Psych: Appropriate mood and affect. Neurologic: Mental status: Alert, oriented to person, place, and time, thought content appropriate. UC Treatments / Results  Labs (all labs ordered are listed, but only abnormal results are displayed) Labs Reviewed - No data to display  EKG None  Radiology No results found.  Procedures Procedures (including critical care time)  Medications Ordered in UC Medications - No data to display  Initial Impression / Assessment and Plan / UC Course  I have reviewed the triage vital signs and the nursing notes.  Pertinent labs & imaging results that were available during my care of the patient were reviewed by me and considered in my medical decision making (see chart for details).      Patient presents with seven days of cough and congestion which are improving today without treatment. Cough is lingering and causes insomnia at night. Recommend symptomatic treatment only and hydration. Work note provided. Patient stable for discharge.   Final Clinical Impressions(s) / UC Diagnoses   Final diagnoses:  Viral URI with cough   Discharge Instructions   None    ED Prescriptions    Medication Sig Dispense Auth. Provider   benzonatate (TESSALON) 100 MG capsule Take 1-2 capsules (100-200 mg total)  by mouth 3 (three) times daily as needed for cough. 60 capsule Bing Neighbors, FNP   ipratropium (ATROVENT) 0.03 % nasal spray Place 2 sprays into both nostrils 2 (two) times daily. 30 mL Bing Neighbors, FNP     Controlled Substance Prescriptions [place] Controlled Substance Registry consulted? Not Applicable   Bing Neighbors, FNP 01/10/18 1531

## 2018-01-10 NOTE — ED Triage Notes (Signed)
Pt here for URI sx with cough x 5 days with episode of post tussive vomiting x 1 today

## 2019-04-06 ENCOUNTER — Other Ambulatory Visit: Payer: Self-pay

## 2019-04-06 ENCOUNTER — Ambulatory Visit (HOSPITAL_COMMUNITY)
Admission: EM | Admit: 2019-04-06 | Discharge: 2019-04-06 | Disposition: A | Discharge status: Home / Self Care | Payer: Self-pay | Attending: Family Medicine | Admitting: Family Medicine

## 2019-04-06 ENCOUNTER — Telehealth (HOSPITAL_COMMUNITY): Payer: Self-pay | Admitting: Emergency Medicine

## 2019-04-06 ENCOUNTER — Encounter (HOSPITAL_COMMUNITY): Payer: Self-pay

## 2019-04-06 DIAGNOSIS — S61213A Laceration without foreign body of left middle finger without damage to nail, initial encounter: Secondary | ICD-10-CM

## 2019-04-06 DIAGNOSIS — Z23 Encounter for immunization: Secondary | ICD-10-CM

## 2019-04-06 MED ORDER — TETANUS-DIPHTH-ACELL PERTUSSIS 5-2.5-18.5 LF-MCG/0.5 IM SUSP
INTRAMUSCULAR | Status: AC
  Filled 2019-04-06: qty 0.5

## 2019-04-06 MED ORDER — TETANUS-DIPHTH-ACELL PERTUSSIS 5-2.5-18.5 LF-MCG/0.5 IM SUSP
0.5000 mL | Freq: Once | INTRAMUSCULAR | Status: AC
  Administered 2019-04-06: 12:00:00 0.5 mL via INTRAMUSCULAR

## 2019-04-06 NOTE — ED Triage Notes (Signed)
Pt state he cut his finger with a knife trying to cook something to eat this morning before work. ( left hand middle finger) the tip.

## 2019-04-06 NOTE — Discharge Instructions (Signed)
We applied Dermabond to the finger.  Make sure this stays dry as possible for the next 24 hours.  The Dermabond will fall off on its own. Updated tetanus today

## 2019-04-07 NOTE — ED Provider Notes (Signed)
[facility]    CSN: [id number] Arrival date & time: 04/06/19  1016      History   Chief Complaint Chief Complaint  Patient presents with   Laceration    HPI JAZZIEL FITZSIMMONS is a 24 y.o. male.   Patient is a 24 year old male that presents today with laceration to tip of left middle finger.  This occurred this morning while he was at work trying to cut something with a knife.  Symptoms been constant.  He cleaned the finger after the incident.  Bleeding is controlled.  Unsure of last tetanus shot.  Sensation intact.  ROS per HPI      Past Medical History:  Diagnosis Date   Allergy    Asthma    Attention deficit disorder    Migraines     Patient Active Problem List   Diagnosis Date Noted   Neck pain, acute 10/18/2013   Viral gastroenteritis 06/25/2013   Left hand pain 12/22/2012   Cough 11/02/2012   Tobacco abuse 08/05/2012   ATTENTION DEFICIT, W/HYPERACTIVITY 04/17/2006    Past Surgical History:  Procedure Laterality Date   ADENOIDECTOMY     HYDROCELE EXCISION         Home Medications    Prior to Admission medications   Medication Sig Start Date End Date Taking? Authorizing Provider  EPINEPHrine (EPIPEN) 0.3 mg/0.3 mL SOAJ injection Inject 0.3 mLs (0.3 mg total) into the muscle once. As needed for allergic reaction 11/02/12   Ozella Rocks, MD  albuterol (PROVENTIL HFA;VENTOLIN HFA) 108 (90 BASE) MCG/ACT inhaler Inhale 2 puffs into the lungs every 4 (four) hours as needed. For shortness of breath 11/02/12 04/06/19  Ozella Rocks, MD  fluticasone [facility]) 50 MCG/ACT nasal spray Place 2 sprays into the nose daily. 11/02/12 04/06/19  Ozella Rocks, MD  ipratropium (ATROVENT) 0.03 % nasal spray Place 2 sprays into both nostrils 2 (two) times daily. 01/10/18 04/06/19  Bing Neighbors, FNP  methylphenidate (CONCERTA) 27 MG CR tablet Take 1 tablet (27 mg total) by mouth every morning. Patient not taking: Reported on 01/10/2018  09/04/12 04/06/19  Ozella Rocks, MD    Family History Family History  Problem Relation Age of Onset   Bipolar disorder Mother     Social History Social History   Tobacco Use   Smoking status: Former Smoker    Types: Cigarettes  Substance Use Topics   Alcohol use: No   Drug use: No     Allergies   Bee venom and Cefaclor   Review of Systems Review of Systems   Physical Exam Triage Vital Signs ED Triage Vitals  Enc Vitals Group     BP 04/06/19 1052 (!) 145/79     Pulse Rate 04/06/19 1052 92     Resp 04/06/19 1052 16     Temp 04/06/19 1052 97.8 F (36.6 C)     Temp Source 04/06/19 1052 Oral     SpO2 04/06/19 1052 98 %     Weight 04/06/19 1051 180 lb (81.6 kg)     Height --      Head Circumference --      Peak Flow --      Pain Score 04/06/19 1050 7     Pain Loc --      Pain Edu? --      Excl. in GC? --    No data found.  Updated Vital Signs BP (!) 145/79 (BP Location: Right Arm)    Pulse  92    Temp 97.8 F (36.6 C) (Oral)    Resp 16    Wt 180 lb (81.6 kg)    SpO2 98%    BMI 28.19 kg/m   Visual Acuity Right Eye Distance:   Left Eye Distance:   Bilateral Distance:    Right Eye Near:   Left Eye Near:    Bilateral Near:     Physical Exam Vitals and nursing note reviewed.  Constitutional:      Appearance: Normal appearance.  HENT:     Head: Normocephalic and atraumatic.     Nose: Nose normal.  Eyes:     Conjunctiva/sclera: Conjunctivae normal.  Pulmonary:     Effort: Pulmonary effort is normal.  Musculoskeletal:        General: Normal range of motion.     Cervical back: Normal range of motion.  Skin:    General: Skin is warm and dry.     Comments: Half centimeter superficial laceration to tip of left middle finger.  Bleeding controlled  Neurological:     Mental Status: He is alert.  Psychiatric:        Mood and Affect: Mood normal.      UC Treatments / Results  Labs (all labs ordered are listed, but only abnormal results are  displayed) Labs Reviewed - No data to display  EKG   Radiology No results found.  Procedures Laceration Repair  Date/Time: 04/07/2019 2:03 PM Performed by: Orvan July, NP Authorized by: Orvan July, NP   Consent:    Consent obtained:  Verbal   Consent given by:  Patient   Risks discussed:  Infection and pain   Alternatives discussed:  No treatment Anesthesia (see MAR for exact dosages):    Anesthesia method:  None Laceration details:    Location:  Finger   Finger location:  L long finger   Length (cm):  0.5 Repair type:    Repair type:  Simple Treatment:    Area cleansed with:  Saline   Amount of cleaning:  Standard Skin repair:    Repair method:  Tissue adhesive Approximation:    Approximation:  Close Post-procedure details:    Dressing:  Open (no dressing)   Patient tolerance of procedure:  Tolerated well, no immediate complications   (including critical care time)  Medications Ordered in UC Medications  Tdap (BOOSTRIX) injection 0.5 mL (0.5 mLs Intramuscular Given 04/06/19 1149)    Initial Impression / Assessment and Plan / UC Course  I have reviewed the triage vital signs and the nursing notes.  Pertinent labs & imaging results that were available during my care of the patient were reviewed by me and considered in my medical decision making (see chart for details).     Laceration of left inner finger.  Wound cleaned and closed using Dermabond. Patient tolerated well.  Final Clinical Impressions(s) / UC Diagnoses   Final diagnoses:  Laceration of left middle finger without foreign body without damage to nail, initial encounter     Discharge Instructions     We applied Dermabond to the finger.  Make sure this stays dry as possible for the next 24 hours.  The Dermabond will fall off on its own. Updated tetanus today    ED Prescriptions    None     PDMP not reviewed this encounter.   Loura Halt A, NP 04/07/19 1404

## 2023-07-30 ENCOUNTER — Encounter (HOSPITAL_COMMUNITY): Payer: Self-pay | Admitting: Emergency Medicine

## 2023-07-30 ENCOUNTER — Other Ambulatory Visit: Payer: Self-pay

## 2023-07-30 ENCOUNTER — Emergency Department (HOSPITAL_COMMUNITY)
Admission: EM | Admit: 2023-07-30 | Discharge: 2023-07-30 | Disposition: A | Discharge status: Home / Self Care | Payer: Self-pay | Attending: Emergency Medicine | Admitting: Emergency Medicine

## 2023-07-30 ENCOUNTER — Emergency Department (HOSPITAL_COMMUNITY): Payer: Self-pay

## 2023-07-30 DIAGNOSIS — R1031 Right lower quadrant pain: Secondary | ICD-10-CM | POA: Insufficient documentation

## 2023-07-30 DIAGNOSIS — J45909 Unspecified asthma, uncomplicated: Secondary | ICD-10-CM | POA: Insufficient documentation

## 2023-07-30 LAB — URINALYSIS, ROUTINE W REFLEX MICROSCOPIC
Bacteria, UA: NONE SEEN
Bilirubin Urine: NEGATIVE
Glucose, UA: NEGATIVE mg/dL
Ketones, ur: NEGATIVE mg/dL
Leukocytes,Ua: NEGATIVE
Nitrite: NEGATIVE
Protein, ur: 30 mg/dL — AB
RBC / HPF: >50 RBC/hpf (ref 0–5)
Specific Gravity, Urine: 1.014 (ref 1.005–1.030)
pH: 5 (ref 5.0–8.0)

## 2023-07-30 LAB — CBC
HCT: 44.1 % (ref 39.0–52.0)
Hemoglobin: 15.5 g/dL (ref 13.0–17.0)
MCH: 31.4 pg (ref 26.0–34.0)
MCHC: 35.1 g/dL (ref 30.0–36.0)
MCV: 89.5 fL (ref 80.0–100.0)
Platelets: 201 10*3/uL (ref 150–400)
RBC: 4.93 MIL/uL (ref 4.22–5.81)
RDW: 12.4 % (ref 11.5–15.5)
WBC: 10.7 10*3/uL — ABNORMAL HIGH (ref 4.0–10.5)
nRBC: 0 % (ref 0.0–0.2)

## 2023-07-30 LAB — COMPREHENSIVE METABOLIC PANEL WITH GFR
ALT: 19 U/L (ref 0–44)
AST: 21 U/L (ref 15–41)
Albumin: 4.3 g/dL (ref 3.5–5.0)
Alkaline Phosphatase: 64 U/L (ref 38–126)
Anion gap: 10 (ref 5–15)
BUN: 7 mg/dL (ref 6–20)
CO2: 24 mmol/L (ref 22–32)
Calcium: 9.3 mg/dL (ref 8.9–10.3)
Chloride: 104 mmol/L (ref 98–111)
Creatinine, Ser: 1.36 mg/dL — ABNORMAL HIGH (ref 0.61–1.24)
GFR, Estimated: >60 mL/min (ref >60)
Glucose, Bld: 106 mg/dL — ABNORMAL HIGH (ref 70–99)
Potassium: 3.9 mmol/L (ref 3.5–5.1)
Sodium: 138 mmol/L (ref 135–145)
Total Bilirubin: 0.5 mg/dL (ref 0.0–1.2)
Total Protein: 6.7 g/dL (ref 6.5–8.1)

## 2023-07-30 LAB — LIPASE, BLOOD: Lipase: 32 U/L (ref 11–51)

## 2023-07-30 MED ORDER — IOHEXOL 350 MG/ML SOLN
75.0000 mL | Freq: Once | INTRAVENOUS | Status: AC | PRN
  Administered 2023-07-30: 75 mL via INTRAVENOUS

## 2023-07-30 MED ORDER — ONDANSETRON 4 MG PO TBDP: 4.0000 mg | ORAL_TABLET | Freq: Three times a day (TID) | ORAL | 0 refills | Status: AC | PRN

## 2023-07-30 MED ORDER — ACETAMINOPHEN 500 MG PO TABS
1000.0000 mg | ORAL_TABLET | Freq: Once | ORAL | Status: AC
  Administered 2023-07-30: 1000 mg via ORAL
  Filled 2023-07-30: qty 2

## 2023-07-30 MED ORDER — ONDANSETRON HCL 4 MG/2ML IJ SOLN
4.0000 mg | Freq: Four times a day (QID) | INTRAMUSCULAR | Status: DC | PRN
  Administered 2023-07-30: 4 mg via INTRAVENOUS
  Filled 2023-07-30: qty 2

## 2023-07-30 MED ORDER — SODIUM CHLORIDE 0.9 % IV BOLUS
1000.0000 mL | Freq: Once | INTRAVENOUS | Status: AC
  Administered 2023-07-30: 1000 mL via INTRAVENOUS

## 2023-07-30 MED ORDER — MORPHINE SULFATE (PF) 4 MG/ML IV SOLN
4.0000 mg | Freq: Once | INTRAVENOUS | Status: AC
  Administered 2023-07-30: 4 mg via INTRAVENOUS
  Filled 2023-07-30: qty 1

## 2023-07-30 NOTE — ED Notes (Signed)
 Patient transported to CT

## 2023-07-30 NOTE — ED Triage Notes (Signed)
 Pt c/o RLQ abdominal pain since 0300 this AM. Endorses nausea.

## 2023-07-30 NOTE — Discharge Instructions (Addendum)
 It was a pleasure caring for you today.  Please follow-up with your primary care provider in the next 48 to 72 hours.  Seek emergency care if experiencing any new or worsening symptoms.  Alternating between 650 mg Tylenol and 400 mg Advil: The best way to alternate taking Acetaminophen (example Tylenol) and Ibuprofen (example Advil/Motrin) is to take them 3 hours apart. For example, if you take ibuprofen at 6 am you can then take Tylenol at 9 am. You can continue this regimen throughout the day, making sure you do not exceed the recommended maximum dose for each drug.

## 2023-07-30 NOTE — ED Provider Notes (Signed)
 [facility] Provider Note   CSN: [id number] Arrival date & time: 07/30/23  0533     History  Chief Complaint  Patient presents with   Abdominal Pain    Nicholas Franco is a 28 y.o. male with PMHx asthma, ADHD, migraines who presents to ED concerned for RLQ pain since 3AM. Last BM around 12AM. Denies fever, vomiting, diarrhea, hematochezia, hematuria, dysuria. Endorses nausea.    Abdominal Pain      Home Medications Prior to Admission medications   Medication Sig Start Date End Date Taking? Authorizing Provider  ondansetron  (ZOFRAN -ODT) 4 MG disintegrating tablet Take 1 tablet (4 mg total) by mouth every 8 (eight) hours as needed for nausea. 07/30/23  Yes Dorisann Garre F, PA-C  EPINEPHrine  (EPIPEN ) 0.3 mg/0.3 mL SOAJ injection Inject 0.3 mLs (0.3 mg total) into the muscle once. As needed for allergic reaction 11/02/12   Otilia Bloch, MD  albuterol  (PROVENTIL  HFA;VENTOLIN  HFA) 108 (90 BASE) MCG/ACT inhaler Inhale 2 puffs into the lungs every 4 (four) hours as needed. For shortness of breath 11/02/12 04/06/19  Otilia Bloch, MD  fluticasone  (FLONASE ) 50 MCG/ACT nasal spray Place 2 sprays into the nose daily. 11/02/12 04/06/19  Otilia Bloch, MD  ipratropium (ATROVENT ) 0.03 % nasal spray Place 2 sprays into both nostrils 2 (two) times daily. 01/10/18 04/06/19  Buena Carmine, NP  methylphenidate  (CONCERTA ) 27 MG CR tablet Take 1 tablet (27 mg total) by mouth every morning. Patient not taking: Reported on 01/10/2018 09/04/12 04/06/19  Otilia Bloch, MD      Allergies    Bee venom and Cefaclor    Review of Systems   Review of Systems  Gastrointestinal:  Positive for abdominal pain.    Physical Exam Updated Vital Signs BP 126/78 (BP Location: Right Arm)   Pulse 85   Temp 97.7 F (36.5 C) (Oral)   Resp 20   Ht 5' 7 (1.702 m)   Wt 79.4 kg   SpO2 100%   BMI 27.41 kg/m  Physical Exam Vitals and nursing note  reviewed.  Constitutional:      General: He is not in acute distress.    Appearance: He is not ill-appearing or toxic-appearing.  HENT:     Head: Normocephalic and atraumatic.     Mouth/Throat:     Mouth: Mucous membranes are moist.     Pharynx: No posterior oropharyngeal erythema.  Eyes:     General: No scleral icterus.       Right eye: No discharge.        Left eye: No discharge.     Conjunctiva/sclera: Conjunctivae normal.  Cardiovascular:     Rate and Rhythm: Normal rate and regular rhythm.     Pulses: Normal pulses.     Heart sounds: Normal heart sounds. No murmur heard. Pulmonary:     Effort: Pulmonary effort is normal. No respiratory distress.     Breath sounds: Normal breath sounds. No wheezing, rhonchi or rales.  Abdominal:     General: Abdomen is flat. Bowel sounds are normal. There is no distension.     Palpations: Abdomen is soft. There is no mass.     Tenderness: There is abdominal tenderness in the right lower quadrant.  Musculoskeletal:     Right lower leg: No edema.     Left lower leg: No edema.  Skin:    General: Skin is warm and dry.     Findings: No rash.  Neurological:     General: No focal deficit present.     Mental Status: He is alert and oriented to person, place, and time. Mental status is at baseline.  Psychiatric:        Mood and Affect: Mood normal.        Behavior: Behavior normal.     ED Results / Procedures / Treatments   Labs (all labs ordered are listed, but only abnormal results are displayed) Labs Reviewed  COMPREHENSIVE METABOLIC PANEL WITH GFR - Abnormal; Notable for the following components:      Result Value   Glucose, Bld 106 (*)    Creatinine, Ser 1.36 (*)    All other components within normal limits  CBC - Abnormal; Notable for the following components:   WBC 10.7 (*)    All other components within normal limits  URINALYSIS, ROUTINE W REFLEX MICROSCOPIC - Abnormal; Notable for the following components:   APPearance HAZY  (*)    Hgb urine dipstick LARGE (*)    Protein, ur 30 (*)    All other components within normal limits  LIPASE, BLOOD    EKG None  Radiology CT ABDOMEN PELVIS W CONTRAST Result Date: 07/30/2023 CLINICAL DATA:  Right lower quadrant pain onset 5 hours ago. EXAM: CT ABDOMEN AND PELVIS WITH CONTRAST TECHNIQUE: Multidetector CT imaging of the abdomen and pelvis was performed using the standard protocol following bolus administration of intravenous contrast. RADIATION DOSE REDUCTION: This exam was performed according to the departmental dose-optimization program which includes automated exposure control, adjustment of the mA and/or kV according to patient size and/or use of iterative reconstruction technique. CONTRAST:  75mL OMNIPAQUE  IOHEXOL  350 MG/ML SOLN COMPARISON:  CT with IV and oral contrast 09/05/2009. FINDINGS: Lower chest: No abnormality. Hepatobiliary: The liver is 20 cm length and mildly steatotic. There is no mass. There is a subcentimeter stone in the distal gallbladder, posterior wall. No other visible stones. No wall thickening or bile duct dilatation. Pancreas: No abnormality. Spleen: No mass.  No splenomegaly. Adrenals/Urinary Tract: No abnormality. Stomach/Bowel: Contracted stomach. Normal caliber unopacified small bowel. The appendix is normal and well seen. There is mild fecal stasis. No evidence of acute colitis or diverticulitis. Vascular/Lymphatic: No significant vascular findings are present. No enlarged abdominal or pelvic lymph nodes. Reproductive: The prostatic size is normal. Both testicles are in the scrotal sac. Other: None. Musculoskeletal: No acute or significant osseous findings. IMPRESSION: 1. No acute CT findings in the abdomen or pelvis. Normal appendix. 2. Constipation. 3. Mildly enlarged and steatotic liver. 4. Cholelithiasis. Electronically Signed   By: Denman Fischer M.D.   On: 07/30/2023 07:35    Procedures Procedures    Medications Ordered in ED Medications   ondansetron  (ZOFRAN ) injection 4 mg (4 mg Intravenous Given 07/30/23 0650)  morphine (PF) 4 MG/ML injection 4 mg (4 mg Intravenous Given 07/30/23 0650)  sodium chloride 0.9 % bolus 1,000 mL (1,000 mLs Intravenous New Bag/Given 07/30/23 0650)  iohexol  (OMNIPAQUE ) 350 MG/ML injection 75 mL (75 mLs Intravenous Contrast Given 07/30/23 0717)  acetaminophen  (TYLENOL ) tablet 1,000 mg (1,000 mg Oral Given 07/30/23 0749)    ED Course/ Medical Decision Making/ A&P                                 Medical Decision Making Amount and/or Complexity of Data Reviewed Labs: ordered. Radiology: ordered.  Risk OTC drugs. Prescription drug management.    This patient presents  to the ED for concern of abdominal pain, this involves an extensive number of treatment options, and is a complaint that carries with it a high risk of complications and morbidity.  The differential diagnosis includes gastroenteritis, colitis, small bowel obstruction, appendicitis, cholecystitis, pancreatitis, nephrolithiasis, UTI, pyleonephritis   Co morbidities that complicate the patient evaluation  none   Additional history obtained:  PCP not in system   Problem List / ED Course / Critical interventions / Medication management  Patient presented for abdominal pain.  Physical exam with RLQ tenderness to palpation.  Rest of physical exam reassuring.  Patient afebrile with stable vitals. I Ordered, and personally interpreted labs.  CMP with mild elevation of creatinine at 1.36 - patient provided with IV fluids.  Lipase within normal limits.  UA not concerning for infection but does have hgb present.  CBC with mild leukocytosis of 10.7.  No anemia. I ordered imaging studies including CT Abd/Pelvis with contrast: evaluate for structural/surgical etiology of patients' severe abdominal pain. I independently visualized and interpreted imaging and I agree with the radiologist interpretation of mild constipation without any other acute  process.  The appendix was well-visualized and reassuring. Shared all results with patient.  Answered all questions.  Patient stating that he is feeling a lot better.  Patient tolerating PO.  It appears that patient's symptoms are due to stomach bug vs muscle strain vs very small kidney stone (given hgb on UA). Will prescribe zofran  for symptomatic control and educated patient on alternating Advil  and Tylenol  for pain management. Patient verbalized understanding of plan.  Patient to follow-up with PCP. I have reviewed the patients home medicines and have made adjustments as needed The patient has been appropriately medically screened and/or stabilized in the ED. I have low suspicion for any other emergent medical condition which would require further screening, evaluation or treatment in the ED or require inpatient management. At time of discharge the patient is hemodynamically stable and in no acute distress. I have discussed work-up results and diagnosis with patient and answered all questions. Patient is agreeable with discharge plan. We discussed strict return precautions for returning to the emergency department and they verbalized understanding.     Social Determinants of Health:  none          Final Clinical Impression(s) / ED Diagnoses Final diagnoses:  Right lower quadrant abdominal pain    Rx / DC Orders ED Discharge Orders          Ordered    ondansetron  (ZOFRAN -ODT) 4 MG disintegrating tablet  Every 8 hours PRN        07/30/23 0803              Hershey Bureau, PA-C 07/30/23 9147    Jerilynn Montenegro, MD 07/30/23 1457

## 2023-10-13 ENCOUNTER — Ambulatory Visit: Payer: Self-pay | Admitting: Nurse Practitioner
